# Patient Record
Sex: Female | Born: 1945 | Race: White | Hispanic: No | Smoking: Never smoker
Health system: Southern US, Community
[De-identification: ages and names within clinical notes are randomized; demographics above are authoritative.]

## PROBLEM LIST (undated history)

## (undated) DIAGNOSIS — K2270 Barrett's esophagus without dysplasia: Secondary | ICD-10-CM

## (undated) DIAGNOSIS — N189 Chronic kidney disease, unspecified: Secondary | ICD-10-CM

## (undated) DIAGNOSIS — T7840XA Allergy, unspecified, initial encounter: Secondary | ICD-10-CM

## (undated) DIAGNOSIS — K219 Gastro-esophageal reflux disease without esophagitis: Secondary | ICD-10-CM

## (undated) DIAGNOSIS — N289 Disorder of kidney and ureter, unspecified: Secondary | ICD-10-CM

## (undated) DIAGNOSIS — I10 Essential (primary) hypertension: Secondary | ICD-10-CM

## (undated) DIAGNOSIS — I4892 Unspecified atrial flutter: Secondary | ICD-10-CM

## (undated) DIAGNOSIS — C801 Malignant (primary) neoplasm, unspecified: Secondary | ICD-10-CM

## (undated) HISTORY — PX: BREAST LUMPECTOMY: SHX2

## (undated) HISTORY — PX: TONSILLECTOMY: SHX5217

## (undated) HISTORY — DX: Barrett's esophagus without dysplasia: K22.70

## (undated) HISTORY — DX: Unspecified atrial flutter: I48.92

---
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
## 1997-03-30 DIAGNOSIS — C801 Malignant (primary) neoplasm, unspecified: Secondary | ICD-10-CM

## 1997-03-30 HISTORY — DX: Malignant (primary) neoplasm, unspecified: C80.1

## 2018-08-09 ENCOUNTER — Other Ambulatory Visit (HOSPITAL_COMMUNITY): Payer: Self-pay | Admitting: Unknown Physician Specialty

## 2018-08-09 ENCOUNTER — Other Ambulatory Visit: Payer: Self-pay | Admitting: Unknown Physician Specialty

## 2018-08-09 DIAGNOSIS — K115 Sialolithiasis: Secondary | ICD-10-CM

## 2018-08-15 ENCOUNTER — Other Ambulatory Visit: Payer: Self-pay

## 2018-08-15 ENCOUNTER — Ambulatory Visit
Admission: RE | Admit: 2018-08-15 | Discharge: 2018-08-15 | Disposition: A | Discharge status: Home / Self Care | Payer: Medicare Other | Source: Ambulatory Visit | Attending: Unknown Physician Specialty | Admitting: Unknown Physician Specialty

## 2018-08-15 DIAGNOSIS — K115 Sialolithiasis: Secondary | ICD-10-CM | POA: Diagnosis not present

## 2018-08-15 HISTORY — DX: Disorder of kidney and ureter, unspecified: N28.9

## 2018-08-15 HISTORY — DX: Essential (primary) hypertension: I10

## 2018-08-15 LAB — POCT I-STAT CREATININE: Creatinine, Ser: 1.4 mg/dL — ABNORMAL HIGH (ref 0.44–1.00)

## 2018-08-15 MED ORDER — IOHEXOL 300 MG/ML  SOLN
75.0000 mL | Freq: Once | INTRAMUSCULAR | Status: AC | PRN
  Administered 2018-08-15: 14:00:00 75 mL via INTRAVENOUS

## 2018-08-16 ENCOUNTER — Other Ambulatory Visit: Payer: Self-pay | Admitting: Unknown Physician Specialty

## 2018-08-16 DIAGNOSIS — I6523 Occlusion and stenosis of bilateral carotid arteries: Secondary | ICD-10-CM

## 2018-08-19 ENCOUNTER — Emergency Department
Admission: EM | Admit: 2018-08-19 | Discharge: 2018-08-19 | Disposition: A | Discharge status: Home / Self Care | Payer: Medicare Other | Attending: Emergency Medicine | Admitting: Emergency Medicine

## 2018-08-19 ENCOUNTER — Encounter: Payer: Self-pay | Admitting: Emergency Medicine

## 2018-08-19 ENCOUNTER — Other Ambulatory Visit: Payer: Self-pay

## 2018-08-19 ENCOUNTER — Emergency Department: Payer: Medicare Other

## 2018-08-19 DIAGNOSIS — R079 Chest pain, unspecified: Secondary | ICD-10-CM | POA: Diagnosis not present

## 2018-08-19 DIAGNOSIS — I10 Essential (primary) hypertension: Secondary | ICD-10-CM | POA: Diagnosis not present

## 2018-08-19 LAB — CBC
HCT: 40.9 % (ref 36.0–46.0)
Hemoglobin: 13 g/dL (ref 12.0–15.0)
MCH: 30.4 pg (ref 26.0–34.0)
MCHC: 31.8 g/dL (ref 30.0–36.0)
MCV: 95.6 fL (ref 80.0–100.0)
Platelets: 221 10*3/uL (ref 150–400)
RBC: 4.28 MIL/uL (ref 3.87–5.11)
RDW: 13.4 % (ref 11.5–15.5)
WBC: 7.6 10*3/uL (ref 4.0–10.5)
nRBC: 0 % (ref 0.0–0.2)

## 2018-08-19 LAB — TROPONIN I
Troponin I: <0.03 ng/mL (ref <0.03)
Troponin I: <0.03 ng/mL (ref <0.03)

## 2018-08-19 LAB — BASIC METABOLIC PANEL
Anion gap: 7 (ref 5–15)
BUN: 19 mg/dL (ref 8–23)
CO2: 25 mmol/L (ref 22–32)
Calcium: 8.7 mg/dL — ABNORMAL LOW (ref 8.9–10.3)
Chloride: 106 mmol/L (ref 98–111)
Creatinine, Ser: 1.24 mg/dL — ABNORMAL HIGH (ref 0.44–1.00)
GFR calc Af Amer: 51 mL/min — ABNORMAL LOW (ref >60)
GFR calc non Af Amer: 44 mL/min — ABNORMAL LOW (ref >60)
Glucose, Bld: 104 mg/dL — ABNORMAL HIGH (ref 70–99)
Potassium: 4.2 mmol/L (ref 3.5–5.1)
Sodium: 138 mmol/L (ref 135–145)

## 2018-08-19 MED ORDER — SODIUM CHLORIDE 0.9% FLUSH: 3.0000 mL | Freq: Once | INTRAVENOUS | Status: DC

## 2018-08-19 MED ORDER — ASPIRIN 81 MG PO CHEW
324.0000 mg | CHEWABLE_TABLET | Freq: Once | ORAL | Status: AC
  Administered 2018-08-19: 324 mg via ORAL
  Filled 2018-08-19: qty 4

## 2018-08-19 NOTE — ED Notes (Signed)
Pt reports last night she was having palpitations. Pt states that she had some nausea and left arm discomfort with the palpitations. Pt went to bed, this morning palpitations were better but she was still having some left arm discomfort and nausea. Pt states that she has had 1 episode of A fib in past. Pt is from [place].

## 2018-08-19 NOTE — Discharge Instructions (Signed)

## 2018-08-19 NOTE — ED Triage Notes (Signed)
States last night while watching TV, felt left arm pain.  Then felt heart palpitations.  States symptoms improved and patient was able to sleep.  This am left arm a little 'uncomfortable' and small nausea.

## 2018-08-19 NOTE — ED Provider Notes (Signed)
[facility] Emergency Department Provider Note  ____________________________________________  Time seen: Approximately 1:55 PM  I have reviewed the triage vital signs and the nursing notes.   HISTORY  Chief Complaint Chest Pain   HPI Shirley Hardy is a 73 y.o. female with a history of hypertension and hyperlipidemia who presents for evaluation of chest pain.  Patient lives in [place] and is here visiting her mother.  Over the last several days her allergies have been flaring up due to pollen.  She has had a dry cough and some congestion/postnasal drip.  Last night, she was watching TV when she started having soreness in her left arm.  She then started feeling her heart flopping in her chest, she denies pain in her chest.  She checked her pulse which was elevated and irregular.  She felt a little bit of nausea.  She went to bed and was able to fall asleep.  This morning when she woke up she was no longer feeling palpitations but had mild discomfort in the left side of her neck.  She then decided to come to the emergency room to get evaluated.  Patient has a history of one episode of A. fib in the past.  She never had A. fib again.  She is on metoprolol.  She is not on any blood thinners.  She flew from [place] several weeks ago to be with her mother.  She has no personal or family history of blood clots, recent long travel or immobilization, leg pain or swelling, hemoptysis, or exogenous hormones.  No exposures to COVID.  No fever or chills.  No history of smoking.  No personal or family history of heart attacks  Past Medical History:  Diagnosis Date  . Hypertension   . Renal insufficiency    Allergies Patient has no known allergies.  FH No history of CAD, MI, PE, DVT  Social History Social History   Tobacco Use  . Smoking status: Never Smoker  . Smokeless tobacco: Never Used  Substance Use Topics  . Alcohol use: Not on file  . Drug use: Not on file     Review of Systems  Constitutional: Negative for fever. Eyes: Negative for visual changes. ENT: Negative for sore throat. Neck: No neck pain  Cardiovascular: Negative for chest pain. + palpitations Respiratory: Negative for shortness of breath. Gastrointestinal: Negative for abdominal pain, vomiting or diarrhea. + nausea Genitourinary: Negative for dysuria. Musculoskeletal: Negative for back pain. + L arm and L neck pain Skin: Negative for rash. Neurological: Negative for headaches, weakness or numbness. Psych: No SI or HI  ____________________________________________   PHYSICAL EXAM:  VITAL SIGNS: ED Triage Vitals  Enc Vitals Group     BP 08/19/18 1223 (!) 161/79     Pulse Rate 08/19/18 1223 68     Resp 08/19/18 1223 18     Temp 08/19/18 1223 98.2 F (36.8 C)     Temp Source 08/19/18 1223 Oral     SpO2 08/19/18 1223 99 %     Weight 08/19/18 1223 185 lb (83.9 kg)     Height 08/19/18 1223 5' 4.5" (1.638 m)     Head Circumference --      Peak Flow --      Pain Score 08/19/18 1227 1     Pain Loc --      Pain Edu? --      Excl. in [place]? --     Constitutional: Alert and oriented. Well appearing and  in no apparent distress. HEENT:      Head: Normocephalic and atraumatic.         Eyes: Conjunctivae are normal. Sclera is non-icteric.       Mouth/Throat: Mucous membranes are moist.       Neck: Supple with no signs of meningismus. Cardiovascular: Regular rate and rhythm. No murmurs, gallops, or rubs. 2+ symmetrical distal pulses are present in all extremities. No JVD. Respiratory: Normal respiratory effort. Lungs are clear to auscultation bilaterally. No wheezes, crackles, or rhonchi.  Gastrointestinal: Soft, non tender, and non distended with positive bowel sounds. No rebound or guarding. Musculoskeletal: Nontender with normal range of motion in all extremities. No edema, cyanosis, or erythema of extremities. Neurologic: Normal speech and language. Face is symmetric.  Moving all extremities. No gross focal neurologic deficits are appreciated. Skin: Skin is warm, dry and intact. No rash noted. Psychiatric: Mood and affect are normal. Speech and behavior are normal.  ____________________________________________   LABS (all labs ordered are listed, but only abnormal results are displayed)  Labs Reviewed  BASIC METABOLIC PANEL - Abnormal; Notable for the following components:      Result Value   Glucose, Bld 104 (*)    Creatinine, Ser 1.24 (*)    Calcium 8.7 (*)    GFR calc non Af Amer 44 (*)    GFR calc Af Amer 51 (*)    All other components within normal limits  CBC  TROPONIN I  TROPONIN I   ____________________________________________  EKG  ED ECG REPORT I, Rudene Re, the attending physician, personally viewed and interpreted this ECG.  Normal sinus rhythm, rate of 62, first-degree AV block, normal QTC, normal axis, no ST elevations or depressions.  No prior for comparison. ____________________________________________  RADIOLOGY  I have personally reviewed the images performed during this visit and I agree with the Radiologist's read.   Interpretation by Radiologist:  Dg Chest 2 View  Result Date: 08/19/2018 CLINICAL DATA:  Chest pain, shortness of breath. EXAM: CHEST - 2 VIEW COMPARISON:  None. FINDINGS: The heart size and mediastinal contours are within normal limits. Both lungs are clear. No pneumothorax or pleural effusion is noted. The visualized skeletal structures are unremarkable. IMPRESSION: No active cardiopulmonary disease. Electronically Signed   By: Marijo Conception M.D.   On: 08/19/2018 13:22     ____________________________________________   PROCEDURES  Procedure(s) performed: None Procedures Critical Care performed:  None ____________________________________________   INITIAL IMPRESSION / ASSESSMENT AND PLAN / ED COURSE  73 y.o. female with a history of hypertension and hyperlipidemia who presents for  evaluation of left arm pain, irregular heartbeat.  Patient is extremely well-appearing and in no distress, she has normal vital signs, normal work of breathing, normal sats, lungs are clear to auscultation.  Heart regular rate and rhythm with no murmurs noticed.  EKG with no evidence of dysrhythmias or ischemia.  Differential diagnoses including ACS versus dysrhythmias versus viral URI versus pneumonia.  Low suspicion for PE with intermittent symptoms, no risk factors, no tachypnea, no tachycardia, no hypoxia.  Low suspicion for COVID with intermittent symptoms, no fever, or shortness of breath.  Will monitor patient on telemetry for any signs of dysrhythmias.  Heart score of 3, low risk but will further stratify with troponin x2. Will get labs.   _________________________ 2:27 PM on 08/19/2018 -----------------------------------------  Labs showing no leukocytosis, first troponin is negative.  Stable creatinine, normal electrolytes.  Chest x-ray negative for pneumonia, pulmonary edema, pneumothorax.  Second troponin is  pending  Clinical Course as of Aug 19 1506  Fri Aug 19, 2018  1505 Patient remains with no further episodes of chest pain.  Second troponin is pending.  Care transferred to Dr. Cinda Quest.   [CV]    Clinical Course User Index [CV] Alfred Levins [place], MD     As part of my medical decision making, I reviewed the following data within the [facility] notes reviewed and incorporated, Labs reviewed , EKG interpreted , Radiograph reviewed , Notes from prior ED visits and [place] Controlled Substance Database    Pertinent labs & imaging results that were available during my care of the patient were reviewed by me and considered in my medical decision making (see chart for details).    ____________________________________________   FINAL CLINICAL IMPRESSION(S) / ED DIAGNOSES  Final diagnoses:  Chest pain, unspecified type      NEW MEDICATIONS STARTED  DURING THIS VISIT:  ED Discharge Orders    None       Note:  This document was prepared using Dragon voice recognition software and may include unintentional dictation errors.    Alfred Levins, [place], MD 08/19/18 [id number]

## 2018-08-25 ENCOUNTER — Other Ambulatory Visit: Payer: Self-pay

## 2018-08-25 ENCOUNTER — Ambulatory Visit
Admission: RE | Admit: 2018-08-25 | Discharge: 2018-08-25 | Disposition: A | Discharge status: Home / Self Care | Payer: Medicare Other | Source: Ambulatory Visit | Attending: Unknown Physician Specialty | Admitting: Unknown Physician Specialty

## 2018-08-25 DIAGNOSIS — I6523 Occlusion and stenosis of bilateral carotid arteries: Secondary | ICD-10-CM | POA: Insufficient documentation

## 2018-09-12 ENCOUNTER — Other Ambulatory Visit
Admission: RE | Admit: 2018-09-12 | Discharge: 2018-09-12 | Disposition: A | Discharge status: Home / Self Care | Payer: Medicare Other | Source: Ambulatory Visit | Attending: Unknown Physician Specialty | Admitting: Unknown Physician Specialty

## 2018-09-12 ENCOUNTER — Other Ambulatory Visit: Payer: Self-pay

## 2018-09-12 DIAGNOSIS — Z1159 Encounter for screening for other viral diseases: Secondary | ICD-10-CM | POA: Diagnosis present

## 2018-09-13 LAB — NOVEL CORONAVIRUS, NAA (HOSP ORDER, SEND-OUT TO REF LAB; TAT 18-24 HRS): SARS-CoV-2, NAA: NOT DETECTED

## 2018-09-14 NOTE — Discharge Instructions (Signed)
General Anesthesia, Adult, Care After  This sheet gives you information about how to care for yourself after your procedure. Your health care provider may also give you more specific instructions. If you have problems or questions, contact your health care provider.  What can I expect after the procedure?  After the procedure, the following side effects are common:  Pain or discomfort at the IV site.  Nausea.  Vomiting.  Sore throat.  Trouble concentrating.  Feeling cold or chills.  Weak or tired.  Sleepiness and fatigue.  Soreness and body aches. These side effects can affect parts of the body that were not involved in surgery.  Follow these instructions at home:    For at least 24 hours after the procedure:  Have a responsible adult stay with you. It is important to have someone help care for you until you are awake and alert.  Rest as needed.  Do not:  Participate in activities in which you could fall or become injured.  Drive.  Use heavy machinery.  Drink alcohol.  Take sleeping pills or medicines that cause drowsiness.  Make important decisions or sign legal documents.  Take care of children on your own.  Eating and drinking  Follow any instructions from your health care provider about eating or drinking restrictions.  When you feel hungry, start by eating small amounts of foods that are soft and easy to digest (bland), such as toast. Gradually return to your regular diet.  Drink enough fluid to keep your urine pale yellow.  If you vomit, rehydrate by drinking water, juice, or clear broth.  General instructions  If you have sleep apnea, surgery and certain medicines can increase your risk for breathing problems. Follow instructions from your health care provider about wearing your sleep device:  Anytime you are sleeping, including during daytime naps.  While taking prescription pain medicines, sleeping medicines, or medicines that make you drowsy.  Return to your normal activities as told by your health care  provider. Ask your health care provider what activities are safe for you.  Take over-the-counter and prescription medicines only as told by your health care provider.  If you smoke, do not smoke without supervision.  Keep all follow-up visits as told by your health care provider. This is important.  Contact a health care provider if:  You have nausea or vomiting that does not get better with medicine.  You cannot eat or drink without vomiting.  You have pain that does not get better with medicine.  You are unable to pass urine.  You develop a skin rash.  You have a fever.  You have redness around your IV site that gets worse.  Get help right away if:  You have difficulty breathing.  You have chest pain.  You have blood in your urine or stool, or you vomit blood.  Summary  After the procedure, it is common to have a sore throat or nausea. It is also common to feel tired.  Have a responsible adult stay with you for the first 24 hours after general anesthesia. It is important to have someone help care for you until you are awake and alert.  When you feel hungry, start by eating small amounts of foods that are soft and easy to digest (bland), such as toast. Gradually return to your regular diet.  Drink enough fluid to keep your urine pale yellow.  Return to your normal activities as told by your health care provider. Ask your health care   provider what activities are safe for you.  This information is not intended to replace advice given to you by your health care provider. Make sure you discuss any questions you have with your health care provider.  Document Released: 06/22/2000 Document Revised: 10/30/2016 Document Reviewed: 10/30/2016  Elsevier Interactive Patient Education  2019 [employer]

## 2018-09-15 ENCOUNTER — Other Ambulatory Visit: Payer: Self-pay

## 2018-09-15 ENCOUNTER — Encounter
Admission: RE | Disposition: A | Discharge status: Home / Self Care | Payer: Self-pay | Source: Home / Self Care | Attending: Unknown Physician Specialty

## 2018-09-15 ENCOUNTER — Ambulatory Visit
Admission: RE | Admit: 2018-09-15 | Discharge: 2018-09-15 | Disposition: A | Discharge status: Home / Self Care | Payer: Medicare Other | Attending: Unknown Physician Specialty | Admitting: Unknown Physician Specialty

## 2018-09-15 ENCOUNTER — Ambulatory Visit: Payer: Medicare Other | Admitting: Anesthesiology

## 2018-09-15 DIAGNOSIS — K115 Sialolithiasis: Secondary | ICD-10-CM | POA: Insufficient documentation

## 2018-09-15 DIAGNOSIS — I10 Essential (primary) hypertension: Secondary | ICD-10-CM | POA: Diagnosis not present

## 2018-09-15 DIAGNOSIS — K219 Gastro-esophageal reflux disease without esophagitis: Secondary | ICD-10-CM | POA: Insufficient documentation

## 2018-09-15 HISTORY — DX: Chronic kidney disease, unspecified: N18.9

## 2018-09-15 HISTORY — DX: Gastro-esophageal reflux disease without esophagitis: K21.9

## 2018-09-15 HISTORY — PX: SALIVARY STONE REMOVAL: SHX5213

## 2018-09-15 HISTORY — DX: Allergy, unspecified, initial encounter: T78.40XA

## 2018-09-15 SURGERY — REMOVAL, CALCULUS, SALIVARY DUCT
Anesthesia: General | Site: Mouth | Laterality: Right

## 2018-09-15 MED ORDER — LIDOCAINE-EPINEPHRINE 1 %-1:100000 IJ SOLN
INTRAMUSCULAR | Status: DC | PRN
  Administered 2018-09-15: .5 mL

## 2018-09-15 MED ORDER — LIDOCAINE HCL (CARDIAC) PF 100 MG/5ML IV SOSY
PREFILLED_SYRINGE | INTRAVENOUS | Status: DC | PRN
  Administered 2018-09-15: 20 mg via INTRAVENOUS

## 2018-09-15 MED ORDER — PROPOFOL 500 MG/50ML IV EMUL
INTRAVENOUS | Status: DC | PRN
  Administered 2018-09-15: 100 ug/kg/min via INTRAVENOUS

## 2018-09-15 MED ORDER — FENTANYL CITRATE (PF) 100 MCG/2ML IJ SOLN
25.0000 ug | INTRAMUSCULAR | Status: DC | PRN
  Administered 2018-09-15: 09:00:00 50 ug via INTRAVENOUS

## 2018-09-15 MED ORDER — AMOXICILLIN-POT CLAVULANATE 875-125 MG PO TABS: 1.0000 | ORAL_TABLET | Freq: Two times a day (BID) | ORAL | 0 refills | Status: AC

## 2018-09-15 MED ORDER — OXYCODONE HCL 5 MG/5ML PO SOLN: 5.0000 mg | Freq: Once | ORAL | Status: DC | PRN

## 2018-09-15 MED ORDER — MIDAZOLAM HCL 2 MG/2ML IJ SOLN
INTRAMUSCULAR | Status: DC | PRN
  Administered 2018-09-15: 2 mg via INTRAVENOUS

## 2018-09-15 MED ORDER — ONDANSETRON HCL 4 MG/2ML IJ SOLN
INTRAMUSCULAR | Status: DC | PRN
  Administered 2018-09-15: 4 mg via INTRAVENOUS

## 2018-09-15 MED ORDER — ACETAMINOPHEN 10 MG/ML IV SOLN: 1000.0000 mg | Freq: Once | INTRAVENOUS | Status: DC

## 2018-09-15 MED ORDER — OXYCODONE HCL 5 MG PO TABS: 5.0000 mg | ORAL_TABLET | Freq: Once | ORAL | Status: DC | PRN

## 2018-09-15 MED ORDER — LACTATED RINGERS IV SOLN
INTRAVENOUS | Status: DC
  Administered 2018-09-15: 08:00:00 via INTRAVENOUS

## 2018-09-15 SURGICAL SUPPLY — 14 items
CORD BIP STRL DISP 12FT (MISCELLANEOUS) ×2 IMPLANT
ELECT REM PT RETURN 9FT ADLT (ELECTROSURGICAL) ×3
ELECTRODE REM PT RTRN 9FT ADLT (ELECTROSURGICAL) ×1 IMPLANT
GLOVE BIO SURGEON STRL SZ7.5 (GLOVE) ×4 IMPLANT
KIT TURNOVER KIT A (KITS) ×3 IMPLANT
NDL HYPO 25GX1X1/2 BEV (NEEDLE) ×1 IMPLANT
NEEDLE HYPO 25GX1X1/2 BEV (NEEDLE) ×3 IMPLANT
NS IRRIG 500ML POUR BTL (IV SOLUTION) ×3 IMPLANT
PACK ENT CUSTOM (PACKS) ×3 IMPLANT
STRAP BODY AND KNEE 60X3 (MISCELLANEOUS) ×3 IMPLANT
SUCTION FRAZIER TIP 10 FR DISP (SUCTIONS) ×2 IMPLANT
SUT VICRYL 6-0  S14 CTD (SUTURE) ×2
SUT VICRYL 6-0 S14 CTD (SUTURE) IMPLANT
SYR 10ML LL (SYRINGE) ×3 IMPLANT

## 2018-09-15 NOTE — Anesthesia Procedure Notes (Signed)
Date/Time: 09/15/2018 8:52 AM Performed by: Cameron Ali, CRNA Pre-anesthesia Checklist: Patient identified, Emergency Drugs available, Suction available, Timeout performed and Patient being monitored Patient Re-evaluated:Patient Re-evaluated prior to induction Oxygen Delivery Method: Nasal cannula Placement Confirmation: positive ETCO2

## 2018-09-15 NOTE — Transfer of Care (Signed)
Immediate Anesthesia Transfer of Care Note  Patient: Shirley Hardy  Procedure(s) Performed: SALIVARY STONE REMOVAL/SIALODUCOPLASTY (Right Mouth)  Patient Location: PACU  Anesthesia Type: General  Level of Consciousness: awake, alert  and patient cooperative  Airway and Oxygen Therapy: Patient Spontanous Breathing and Patient connected to supplemental oxygen  Post-op Assessment: Post-op Vital signs reviewed, Patient's Cardiovascular Status Stable, Respiratory Function Stable, Patent Airway and No signs of Nausea or vomiting  Post-op Vital Signs: Reviewed and stable  Complications: No apparent anesthesia complications

## 2018-09-15 NOTE — H&P (Signed)
The patient's history has been reviewed, patient examined, no change in status, stable for surgery.  Questions were answered to the patients satisfaction.  

## 2018-09-15 NOTE — Op Note (Signed)
09/15/2018  9:25 AM    [name]  [id number]   Pre-Op Dx: Sialothiasis  Post-op Dx: SAME  Proc: Excision right submandibular duct stone, sialodochoplasty  Surg:  Roena Malady  Anes:  GOT  EBL: Less than 10 cc  Comp: None  Findings: Large stone right submandibular gland duct  Procedure: Shirley Hardy was identified in the holding area take the operating room placed in supine position.  After IV sedation she was draped in usual fashion for oral surgery.  Palpation of the anterior floor mouth there was an obvious stone in the distal portion of the submandibular duct.  A local anesthetic of 1% lidocaine with 100,000 units epinephrine is used to inject the right anterior floor mouth.  A total of half a cc was used.  An 11 blade was used to incise the area of [place] duct on the right.  The duct was traced proximally and the stone was encountered.  Short sharp scissors were used to open the duct the stone was extracted.  Immediately there was flow of saliva with muco- purulent discharge and another small fragment of stone to come out as well.  The stones were photo documented and sent for specimen.  The area was then repalpated no evidence of residual stone was identified.  Massage of the right submandibular gland showed excellent flow of saliva.  A 6-0 Vicryl was then used to form a sialodochoplasty by sewing the open ends of the submandibular duct to the mucosa to prevent scarring.   Re-massage of the gland showed excellent flow of saliva.  With no active bleeding the patient was turned to anesthesia where she was taken to recovery room in stable condition.  Dispo:   Good  Plan: Discharged home I will have her plenty of fluids clear liquid diet for the next 24 hours use sialagogues to continue salivary flow to prevent during of the duct.  Follow-up 1 week  Roena Malady  09/15/2018 9:25 AM

## 2018-09-15 NOTE — Anesthesia Postprocedure Evaluation (Signed)
Anesthesia Post Note  Patient: Shirley Hardy  Procedure(s) Performed: SALIVARY STONE REMOVAL/SIALODUCOPLASTY (Right Mouth)  Patient location during evaluation: PACU Anesthesia Type: General Level of consciousness: awake Pain management: pain level controlled Vital Signs Assessment: post-procedure vital signs reviewed and stable Respiratory status: respiratory function stable Cardiovascular status: stable Postop Assessment: no signs of nausea or vomiting Anesthetic complications: no    Veda Canning

## 2018-09-15 NOTE — Anesthesia Preprocedure Evaluation (Addendum)
Anesthesia Evaluation  Patient identified by MRN, date of birth, ID band Patient awake    Reviewed: Allergy & Precautions, NPO status , Patient's Chart, lab work & pertinent test results  Airway Mallampati: II  TM Distance: >3 FB     Dental   Pulmonary neg pulmonary ROS,    breath sounds clear to auscultation       Cardiovascular hypertension,  Rhythm:Regular Rate:Normal     Neuro/Psych    GI/Hepatic GERD  ,  Endo/Other  BMI 31  Renal/GU Renal InsufficiencyRenal disease     Musculoskeletal   Abdominal   Peds  Hematology   Anesthesia Other Findings   Reproductive/Obstetrics                             Anesthesia Physical Anesthesia Plan  ASA: II  Anesthesia Plan: General   Post-op Pain Management:    Induction:   PONV Risk Score and Plan:   Airway Management Planned: Natural Airway and Nasal Cannula  Additional Equipment:   Intra-op Plan:   Post-operative Plan:   Informed Consent: I have reviewed the patients History and Physical, chart, labs and discussed the procedure including the risks, benefits and alternatives for the proposed anesthesia with the patient or authorized representative who has indicated his/her understanding and acceptance.       Plan Discussed with: CRNA  Anesthesia Plan Comments:        Anesthesia Quick Evaluation

## 2018-09-16 ENCOUNTER — Encounter: Payer: Self-pay | Admitting: Unknown Physician Specialty

## 2018-09-19 LAB — SURGICAL PATHOLOGY

## 2018-09-20 ENCOUNTER — Encounter (INDEPENDENT_AMBULATORY_CARE_PROVIDER_SITE_OTHER): Payer: Medicare Other | Admitting: Vascular Surgery

## 2018-09-27 ENCOUNTER — Encounter (INDEPENDENT_AMBULATORY_CARE_PROVIDER_SITE_OTHER): Payer: Self-pay | Admitting: Vascular Surgery

## 2018-09-27 ENCOUNTER — Other Ambulatory Visit: Payer: Self-pay

## 2018-09-27 ENCOUNTER — Ambulatory Visit (INDEPENDENT_AMBULATORY_CARE_PROVIDER_SITE_OTHER): Payer: Medicare Other | Admitting: Vascular Surgery

## 2018-09-27 DIAGNOSIS — I129 Hypertensive chronic kidney disease with stage 1 through stage 4 chronic kidney disease, or unspecified chronic kidney disease: Secondary | ICD-10-CM | POA: Diagnosis not present

## 2018-09-27 DIAGNOSIS — N189 Chronic kidney disease, unspecified: Secondary | ICD-10-CM

## 2018-09-27 DIAGNOSIS — I6529 Occlusion and stenosis of unspecified carotid artery: Secondary | ICD-10-CM | POA: Insufficient documentation

## 2018-09-27 DIAGNOSIS — I6523 Occlusion and stenosis of bilateral carotid arteries: Secondary | ICD-10-CM

## 2018-09-27 DIAGNOSIS — I10 Essential (primary) hypertension: Secondary | ICD-10-CM

## 2018-09-27 DIAGNOSIS — Z79899 Other long term (current) drug therapy: Secondary | ICD-10-CM | POA: Diagnosis not present

## 2018-09-27 NOTE — Progress Notes (Signed)
Patient ID: Shirley Hardy, female   DOB: 06/05/1945, 73 y.o.   MRN: [id number]  Chief Complaint  Patient presents with  . New Patient (Initial Visit)    carotid stenosis    HPI Shirley Hardy is a 73 y.o. female.  I am asked to see the patient by Dr. Tami Ribas for evaluation of carotid stenosis.  The patient reports no focal symptoms of cerebrovascular ischemia. Specifically, the patient denies amaurosis fugax, speech or swallowing difficulties, or arm or leg weakness or numbness.  She recently had an ENT procedure and a carotid duplex was performed as part of her work-up.  I have independently reviewed this carotid duplex.  This demonstrates velocities interpreted by radiologist as less than 50% carotid stenosis on the right and 50 to 69% carotid artery stenosis on the left with velocities on the lower end of the range.   Past Medical History:  Diagnosis Date  . Allergies   . Chronic kidney disease   . GERD (gastroesophageal reflux disease)   . Hypertension   . Renal insufficiency     Past Surgical History:  Procedure Laterality Date  . BREAST LUMPECTOMY Left   . SALIVARY STONE REMOVAL Right 09/15/2018   Procedure: SALIVARY STONE REMOVAL/SIALODUCOPLASTY;  Surgeon: Beverly Gust, MD;  Location: [facility];  Service: ENT;  Laterality: Right;  . TONSILLECTOMY      Family History  Problem Relation Age of Onset  . Cancer Mother   . Breast cancer Mother   . Atrial fibrillation Mother   . Parkinson's disease Brother   No bleeding or clotting disorders  Social History Social History   Tobacco Use  . Smoking status: Never Smoker  . Smokeless tobacco: Never Used  Substance Use Topics  . Alcohol use: Not Currently  . Drug use: Not on file  No IVDU  Allergies  Allergen Reactions  . Contrast Media [Iodinated Diagnostic Agents]     Current Outpatient Medications  Medication Sig Dispense Refill  . acetaminophen (TYLENOL) 500 MG tablet Take 500 mg by mouth 2  (two) times a day.    Marland Kitchen acyclovir (ZOVIRAX) 400 MG tablet Take 400 mg by mouth daily.    Marland Kitchen aspirin EC 81 MG tablet Take 81 mg by mouth daily.    . cetirizine (ZYRTEC) 10 MG tablet Take 10 mg by mouth daily as needed for allergies.    Marland Kitchen losartan (COZAAR) 50 MG tablet Take 50 mg by mouth daily.    Marland Kitchen omeprazole (PRILOSEC) 20 MG capsule Take 20 mg by mouth daily.    . pravastatin (PRAVACHOL) 40 MG tablet Take 40 mg by mouth daily.    . TOPROL XL 25 MG 24 hr tablet Take 25 mg by mouth daily.    . TOPROL XL 50 MG 24 hr tablet Take 50 mg by mouth daily.     No current facility-administered medications for this visit.       REVIEW OF SYSTEMS (Negative unless checked)  Constitutional: [] Weight loss  [] Fever  [] Chills Cardiac: [] Chest pain   [] Chest pressure   [] Palpitations   [] Shortness of breath when laying flat   [] Shortness of breath at rest   [] Shortness of breath with exertion. Vascular:  [] Pain in legs with walking   [] Pain in legs at rest   [] Pain in legs when laying flat   [] Claudication   [] Pain in feet when walking  [] Pain in feet at rest  [] Pain in feet when laying flat   [] History of DVT   []   Phlebitis   [] Swelling in legs   [] Varicose veins   [] Non-healing ulcers Pulmonary:   [] Uses home oxygen   [] Productive cough   [] Hemoptysis   [] Wheeze  [] COPD   [] Asthma Neurologic:  [] Dizziness  [] Blackouts   [] Seizures   [] History of stroke   [] History of TIA  [] Aphasia   [] Temporary blindness   [] Dysphagia   [] Weakness or numbness in arms   [] Weakness or numbness in legs Musculoskeletal:  [x] Arthritis   [] Joint swelling   [x] Joint pain   [] Low back pain Hematologic:  [] Easy bruising  [] Easy bleeding   [] Hypercoagulable state   [] Anemic  [] Hepatitis Gastrointestinal:  [] Blood in stool   [] Vomiting blood  [x] Gastroesophageal reflux/heartburn   [] Abdominal pain Genitourinary:  [x] Chronic kidney disease   [] Difficult urination  [] Frequent urination  [] Burning with urination   [] Hematuria Skin:   [] Rashes   [] Ulcers   [] Wounds Psychological:  [] History of anxiety   []  History of major depression.    Physical Exam BP 128/85   Pulse 60   Resp 16   Ht 5\' 5"  (1.651 m)   Wt 193 lb (87.5 kg)   BMI 32.12 kg/m  Gen:  WD/WN, NAD Head: [place]/AT, No temporalis wasting. Prominent temp pulse not noted. Ear/Nose/Throat: Hearing grossly intact, nares w/o erythema or drainage, oropharynx w/o Erythema/Exudate Eyes: Conjunctiva clear, sclera non-icteric  Neck: trachea midline.  No bruits Pulmonary:  Good air movement, clear to auscultation bilaterally.  Cardiac: RRR, no JVD Vascular:  Vessel Right Left  Radial Palpable Palpable                          PT Palpable Palpable  DP Palpable Palpable   Gastrointestinal: soft, non-tender/non-distended.  Musculoskeletal: M/S 5/5 throughout.  Extremities without ischemic changes.  No deformity or atrophy. No significant LE edema. Neurologic: Sensation grossly intact in extremities.  Symmetrical.  Speech is fluent. Motor exam as listed above. Psychiatric: Judgment intact, Mood & affect appropriate for pt's clinical situation. Dermatologic: No rashes or ulcers noted.  No cellulitis or open wounds.   Radiology No results found.  Labs Recent Results (from the past 2160 hour(s))  I-STAT creatinine     Status: Abnormal   Collection Time: 08/15/18  2:22 PM  Result Value Ref Range   Creatinine, Ser 1.40 (H) 0.44 - 1.00 mg/dL  Basic metabolic panel     Status: Abnormal   Collection Time: 08/19/18 12:22 PM  Result Value Ref Range   Sodium 138 135 - 145 mmol/L   Potassium 4.2 3.5 - 5.1 mmol/L   Chloride 106 98 - 111 mmol/L   CO2 25 22 - 32 mmol/L   Glucose, Bld 104 (H) 70 - 99 mg/dL   BUN 19 8 - 23 mg/dL   Creatinine, Ser 1.24 (H) 0.44 - 1.00 mg/dL   Calcium 8.7 (L) 8.9 - 10.3 mg/dL   GFR calc non Af Amer 44 (L) >60 mL/min   GFR calc Af Amer 51 (L) >60 mL/min   Anion gap 7 5 - 15    Comment: Performed at [facility], [address] [place], [place] [address]  CBC     Status: None   Collection Time: 08/19/18 12:22 PM  Result Value Ref Range   WBC 7.6 4.0 - 10.5 K/uL   RBC 4.28 3.87 - 5.11 MIL/uL   Hemoglobin 13.0 12.0 - 15.0 g/dL   HCT 40.9 36.0 - 46.0 %   MCV 95.6 80.0 - 100.0  fL   MCH 30.4 26.0 - 34.0 pg   MCHC 31.8 30.0 - 36.0 g/dL   RDW 13.4 11.5 - 15.5 %   Platelets 221 150 - 400 K/uL   nRBC 0.0 0.0 - 0.2 %    Comment: Performed at [facility], [address] [place], [place] [address]  Troponin I - ONCE - STAT     Status: None   Collection Time: 08/19/18 12:22 PM  Result Value Ref Range   Troponin I <0.03 <0.03 ng/mL    Comment: Performed at [facility], [address] [place], [place] [address]  Troponin I - Once-Timed     Status: None   Collection Time: 08/19/18  3:31 PM  Result Value Ref Range   Troponin I <0.03 <0.03 ng/mL    Comment: Performed at [facility], [address] [place], [place] [address]  Novel Coronavirus, NAA (hospital order; send-out to ref lab)     Status: None   Collection Time: 09/12/18  2:13 PM   Specimen: Nasopharyngeal Swab; Respiratory  Result Value Ref Range   SARS-CoV-2, NAA NOT DETECTED NOT DETECTED    Comment: (NOTE) This test was developed and its performance characteristics determined by [employer]. This test has not been FDA cleared or approved. This test has been authorized by FDA under an Emergency Use Authorization (EUA). This test is only authorized for the duration of time the declaration that circumstances exist justifying the authorization of the emergency use of in vitro diagnostic tests for detection of SARS-CoV-2 virus and/or diagnosis of COVID-19 infection under section 564(b)(1) of the Act, 21 U.S.C. 026VZC-5(Y)(8), unless the authorization is terminated or revoked sooner. When diagnostic testing is negative, the possibility of a false negative result should be considered in the context of a  patient's recent exposures and the presence of clinical signs and symptoms consistent with COVID-19. An individual without symptoms of COVID-19 and who is not shedding SARS-CoV-2 virus would expect to have a negative (not detected) result in this assay. Performed  At: [facility] [address] [place], [place] [id number] Rush Farmer MD NO:[id number]    Coronavirus Source NASOPHARYNGEAL     Comment: Performed at [facility], [address] [place], [place] [address]  Surgical pathology     Status: None   Collection Time: 09/15/18  9:09 AM  Result Value Ref Range   SURGICAL PATHOLOGY      Surgical Pathology CASE: [id number] PATIENT: [name] Surgical Pathology Report     SPECIMEN SUBMITTED: A. Duct stone, submandibular  CLINICAL HISTORY: None provided  PRE-OPERATIVE DIAGNOSIS: Sialothiasis  POST-OPERATIVE DIAGNOSIS: Same as pre-op     DIAGNOSIS: A. SUBMANDIBULAR GLAND DUCT, CALCULUS; REMOVAL: - FRAGMENTS OF CALCULUS (2). - GROSS EXAMINATION ONLY.   GROSS DESCRIPTION: A. Labeled: Submandibular duct stone Received: In formalin Tissue fragment(s): 2 Size: Aggregate 0.6 x 0.4 x 0.2 cm Description: 2 pale yellow firm tissues possibly calculi. For gross examination only.   Final Diagnosis performed by Raynelle Bring, MD.   Electronically signed 09/19/2018 10:11:14AM The electronic signature indicates that the named Attending Pathologist has evaluated the specimen  Technical component performed at [facility], [address] [place], [place] [address] Lab: [phone] Dir: Rush Farmer, MD, MMM  Professional component performed at  [facility], [facility], [address], [place], [place] [address] Lab: [phone] Dir: Dellia Nims. Rubinas, MD     Assessment/Plan:  Essential hypertension blood pressure control important in reducing the progression of atherosclerotic disease. On  appropriate oral medications.   CKD  (chronic kidney disease) Try to limit contrast with any evaluation.  Follow-up will be with duplex when feasible.  Carotid stenosis I have independently reviewed this carotid duplex.  This demonstrates velocities interpreted by radiologist as less than 50% carotid stenosis on the right and 50 to 69% carotid artery stenosis on the left with velocities on the lower end of the range. At this point, the disease is below the threshold for consideration for prophylactic repair.  Continued follow-up with duplex would be recommended and I would recommend checking that in 5 to 6 months.  She is on a baby aspirin daily and should continue this.  She takes a statin on a less than daily basis and we discussed that taking this daily may slow the progression of the disease.  She does not live locally, but is displaced from [place] caring for her mother.  I discussed with her if she remains here in 6 months I will be happy to perform her follow-up duplex but if she is not, she should have this done when she returns home.  We discussed the pathophysiology and natural history of carotid disease and she voices her understanding at this time.      Leotis Pain 09/27/2018, 11:07 AM   This note was created with Dragon medical transcription system.  Any errors from dictation are unintentional.

## 2018-09-27 NOTE — Assessment & Plan Note (Signed)
blood pressure control important in reducing the progression of atherosclerotic disease. On appropriate oral medications.  

## 2018-09-27 NOTE — Assessment & Plan Note (Signed)
I have independently reviewed this carotid duplex.  This demonstrates velocities interpreted by radiologist as less than 50% carotid stenosis on the right and 50 to 69% carotid artery stenosis on the left with velocities on the lower end of the range. At this point, the disease is below the threshold for consideration for prophylactic repair.  Continued follow-up with duplex would be recommended and I would recommend checking that in 5 to 6 months.  She is on a baby aspirin daily and should continue this.  She takes a statin on a less than daily basis and we discussed that taking this daily may slow the progression of the disease.  She does not live locally, but is displaced from [place] caring for her mother.  I discussed with her if she remains here in 6 months I will be happy to perform her follow-up duplex but if she is not, she should have this done when she returns home.  We discussed the pathophysiology and natural history of carotid disease and she voices her understanding at this time.

## 2018-09-27 NOTE — Assessment & Plan Note (Signed)
Try to limit contrast with any evaluation.  Follow-up will be with duplex when feasible.

## 2018-09-27 NOTE — Patient Instructions (Signed)
Carotid Artery Disease  The carotid arteries are arteries on both sides of the neck. They carry blood to the brain, face, and neck. Carotid artery disease happens when these arteries become smaller (narrow) or get blocked. If these arteries become smaller or get blocked, you are more likely to have a stroke or a warning stroke (transient ischemic attack). Follow these instructions at home:  Take over-the-counter and prescription medicines only as told by your doctor.  Make sure you understand all instructions about your medicines. Do not stop taking your medicines without talking to your doctor first.  Follow your doctor's diet instructions. It is important to follow a healthy diet. ? Eat foods that include plenty of: ? Fresh fruits. ? Vegetables. ? Lean meats. ? Avoid these foods: ? Foods that are high in fat. ? Foods that are high in salt (sodium). ? Foods that are fried. ? Foods that are processed. ? Foods that have few good nutrients (poor nutritional value).  Keep a healthy weight.  Stay active. Get at least 30 minutes of activity every day.  Do not smoke.  Limit alcohol use to: ? No more than 2 drinks a day for men. ? No more than 1 drink a day for women who are not pregnant.  Do not use illegal drugs.  Keep all follow-up visits as told by your doctor. This is important. Contact a doctor if: Get help right away if:  You have any symptoms of stroke or TIA. The acronym BEFAST is an easy way to remember the main warning signs of stroke. ? B = Balance problems. Signs include dizziness, sudden trouble walking, or loss of balance ? E = Eye problems. This includes trouble seeing or a sudden change in vision. ? F = Face changes. This includes sudden weakness or numbness of the face, or the face or eyelid drooping to one side. ? A = Arm weakness or numbness. This happens suddenly and usually on one side of the body. ? S = Speech problems. This includes trouble speaking or  trouble understanding. ? T = Time. Time to call 911 or seek emergency care. Do not wait to see if symptoms go away. Make note of the time your symptoms started.  Other signs of stroke may include: ? A sudden, severe headache with no known cause. ? Feeling sick to your stomach (nauseous) or throwing up (vomiting). ? Seizure. Call your local emergency services (911 in [place] Do notdrive yourself to the clinic or hospital. Summary  The carotid arteries are arteries on both sides of the neck.  If these arteries get smaller or get blocked, you are more likely to have a stroke or a warning stroke (transient ischemic attack).  Take over-the-counter and prescription medicines only as told by your doctor.  Keep all follow-up visits as told by your doctor. This is important. This information is not intended to replace advice given to you by your health care provider. Make sure you discuss any questions you have with your health care provider. Document Released: 03/02/2012 Document Revised: 03/11/2017 Document Reviewed: 03/11/2017 Elsevier Patient Education  2020 Elsevier Inc.  

## 2018-11-18 ENCOUNTER — Other Ambulatory Visit: Payer: Self-pay

## 2018-11-18 ENCOUNTER — Encounter: Payer: Self-pay | Admitting: Cardiothoracic Surgery

## 2018-11-18 ENCOUNTER — Ambulatory Visit (INDEPENDENT_AMBULATORY_CARE_PROVIDER_SITE_OTHER): Payer: Medicare Other | Admitting: Cardiothoracic Surgery

## 2018-11-18 VITALS — BP 154/84 | HR 80 | Temp 97.7°F | Ht 65.0 in | Wt 197.0 lb

## 2018-11-18 DIAGNOSIS — I10 Essential (primary) hypertension: Secondary | ICD-10-CM

## 2018-11-18 DIAGNOSIS — I272 Pulmonary hypertension, unspecified: Secondary | ICD-10-CM | POA: Diagnosis not present

## 2018-11-18 NOTE — Progress Notes (Signed)
Patient ID: Shirley Hardy, female   DOB: [birth year], 73 y.o.   MRN: [id number]  Chief Complaint  Patient presents with  . New Patient (Initial Visit)    pulmonary hypertension    Referred By Dr. Elba Barman Reason for Referral pulmonary hypertension  HPI Location, Quality, Duration, Severity, Timing, Context, Modifying Factors, Associated Signs and Symptoms.  Shirley Hardy is a 73 y.o. female.  She has a past medical history of atrial fibrillation and had an episode that was treated about 2 years ago in [place].  At that time she was undergoing a complete evaluation for the etiology of her atrial fibrillation.  Although she is not aware of any diagnosis that was presented to her she noticed that on her chart she carries the diagnosis of pulmonary hypertension.  She moved here because of the COVID crisis to be with her elderly mother and is been unable to get any details regarding that hospitalization.  Therefore she is unclear if she had an echocardiogram done at that time or not she is also unclear if she had a right heart catheterization or not.  In any event she recently was complaining of some pain under her tongue and this led to the diagnosis of a salivary stone.  A CT scan of the neck was undertaken and the most distal cuts through the neck revealed possible pulmonary hypertension because of an enlarged pulmonary artery.  Otherwise she is essentially asymptomatic.  Her stones are now gone and her tongue pain is gone away.  She states that on rare occasion she may have a fleeting sensation of being slightly short of breath which quickly clears with a few cleansing breaths.  She has no prior history of heart or lung disease.   Past Medical History:  Diagnosis Date  . Allergies   . Atrial flutter ([place])   . Barrett esophagus   . Cancer [facility]) 1999   left breast cancer  . Chronic kidney disease   . GERD (gastroesophageal reflux disease)   . Hypertension   . Renal insufficiency      Past Surgical History:  Procedure Laterality Date  . BREAST LUMPECTOMY Left   . SALIVARY STONE REMOVAL Right 09/15/2018   Procedure: SALIVARY STONE REMOVAL/SIALODUCOPLASTY;  Surgeon: Beverly Gust, MD;  Location: [facility];  Service: ENT;  Laterality: Right;  . TONSILLECTOMY      Family History  Problem Relation Age of Onset  . Breast cancer Mother   . Atrial fibrillation Mother   . Parkinson's disease Brother   . Lung cancer Father   . Cervical cancer Maternal Grandfather     Social History Social History   Tobacco Use  . Smoking status: Never Smoker  . Smokeless tobacco: Never Used  Substance Use Topics  . Alcohol use: Not Currently  . Drug use: Not on file    Allergies  Allergen Reactions  . Contrast Media [Iodinated Diagnostic Agents]   . Latex Rash    Current Outpatient Medications  Medication Sig Dispense Refill  . acetaminophen (TYLENOL) 500 MG tablet Take 500 mg by mouth 2 (two) times a day.    Marland Kitchen acyclovir (ZOVIRAX) 400 MG tablet Take 400 mg by mouth daily.    Marland Kitchen aspirin EC 81 MG tablet Take 81 mg by mouth daily.    . cetirizine (ZYRTEC) 10 MG tablet Take 10 mg by mouth daily as needed for allergies.    Marland Kitchen losartan (COZAAR) 50 MG tablet Take 50 mg by mouth daily.    Marland Kitchen  omeprazole (PRILOSEC) 20 MG capsule Take 20 mg by mouth daily.    . pravastatin (PRAVACHOL) 40 MG tablet Take 40 mg by mouth daily.    . TOPROL XL 25 MG 24 hr tablet Take 25 mg by mouth daily.    . TOPROL XL 50 MG 24 hr tablet Take 50 mg by mouth daily.     No current facility-administered medications for this visit.       Review of Systems A complete review of systems was asked and was negative except for the following positive findings prior history of atrial fibrillation, bronchitis, hypercholesterolemia, hemorrhoids, GE reflux, kidney disease, glasses, left breast surgery with lumpectomy and radiation.  Blood pressure (!) 154/84, pulse 80, temperature 97.7 F (36.5 C),  height 5\' 5"  (1.651 m), weight 197 lb (89.4 kg), SpO2 99 %.  Physical Exam CONSTITUTIONAL:  Pleasant, well-developed, well-nourished, and in no acute distress. EYES: Pupils equal and reactive to light, Sclera non-icteric EARS, NOSE, MOUTH AND THROAT:  The oropharynx was clear.  Dentition is good repair.  Oral mucosa pink and moist. LYMPH NODES:  Lymph nodes in the neck and axillae were normal RESPIRATORY:  Lungs were clear.  Normal respiratory effort without pathologic use of accessory muscles of respiration CARDIOVASCULAR: Heart was regular without murmurs.  There were no carotid bruits. GI: The abdomen was soft, nontender, and nondistended. There were no palpable masses. There was no hepatosplenomegaly. There were normal bowel sounds in all quadrants. GU:  Rectal deferred.   MUSCULOSKELETAL:  Normal muscle strength and tone.  No clubbing or cyanosis.   SKIN:  There were no pathologic skin lesions.  There were no nodules on palpation. NEUROLOGIC:  Sensation is normal.  Cranial nerves are grossly intact. PSYCH:  Oriented to person, place and time.  Mood and affect are normal.  Data Reviewed CT of the neck  I have personally reviewed the patient's imaging, laboratory findings and medical records.    Assessment    I have independently reviewed the patient's CT scan.  There are bilateral enlarged pulmonary arteries.  There is no obvious reason for this.    Plan    I had a long discussion with her regarding how to obtain the records from her prior hospitalization in [place].  She states that she has exhausted all possibilities.  Therefore we will go ahead and get an transthoracic echocardiogram at this time.  She will come back to see me once that is complete.      Nestor Lewandowsky, MD 11/18/2018, 11:42 AM

## 2018-11-18 NOTE — Patient Instructions (Addendum)
Patient to have a echocardiogram scheduled at [facility]  11/29/2018 at 10:00am .  Be there at the medical mall at 945. The patient is aware to call back for any questions or concerns.

## 2018-11-29 ENCOUNTER — Other Ambulatory Visit: Payer: Self-pay

## 2018-11-29 ENCOUNTER — Ambulatory Visit
Admission: RE | Admit: 2018-11-29 | Discharge: 2018-11-29 | Disposition: A | Discharge status: Home / Self Care | Payer: Medicare Other | Source: Ambulatory Visit | Attending: Cardiothoracic Surgery | Admitting: Cardiothoracic Surgery

## 2018-11-29 DIAGNOSIS — I272 Pulmonary hypertension, unspecified: Secondary | ICD-10-CM

## 2018-11-29 NOTE — Progress Notes (Signed)
*  PRELIMINARY RESULTS* Echocardiogram 2D Echocardiogram has been performed.  Shirley Hardy 11/29/2018, 10:29 AM

## 2018-12-09 ENCOUNTER — Encounter: Payer: Self-pay | Admitting: Cardiothoracic Surgery

## 2018-12-09 ENCOUNTER — Other Ambulatory Visit: Payer: Self-pay

## 2018-12-09 ENCOUNTER — Ambulatory Visit (INDEPENDENT_AMBULATORY_CARE_PROVIDER_SITE_OTHER): Payer: Medicare Other | Admitting: Cardiothoracic Surgery

## 2018-12-09 VITALS — BP 135/55 | HR 72 | Temp 97.2°F | Ht 65.0 in | Wt 197.0 lb

## 2018-12-09 DIAGNOSIS — I6523 Occlusion and stenosis of bilateral carotid arteries: Secondary | ICD-10-CM | POA: Diagnosis not present

## 2018-12-09 DIAGNOSIS — I272 Pulmonary hypertension, unspecified: Secondary | ICD-10-CM | POA: Diagnosis not present

## 2018-12-09 NOTE — Progress Notes (Signed)
  Patient ID: Shirley Hardy, female   DOB: April 10, 1945, 73 y.o.   MRN: [id number]  HISTORY: She returns today in follow-up.  She has no new complaints.  She had an echocardiogram performed.  She has attempted to get all of her records from [place] but is not sure that any of her records will be applicable to her current problem.   Vitals:   12/09/18 0949  BP: (!) 135/55  Pulse: 72  Temp: (!) 97.2 F (36.2 C)  SpO2: 97%     EXAM:    Resp: Lungs are clear bilaterally.  No respiratory distress, normal effort. Heart:  Regular without murmurs Abd:  Abdomen is soft, non distended and non tender. No masses are palpable.  There is no rebound and no guarding.  Neurological: Alert and oriented to person, place, and time. Coordination normal.  Skin: Skin is warm and dry. No rash noted. No diaphoretic. No erythema. No pallor.  Psychiatric: Normal mood and affect. Normal behavior. Judgment and thought content normal.    ASSESSMENT: She did have her echocardiogram performed.  That reveals normal left ventricular and right ventricular function.  The right ventricle is not enlarged.  There is mild pulmonary and tricuspid insufficiency suggesting that pulmonary hypertension is not a significant problem.   PLAN:   She will continue to attempt to get her records from [place].  She is heading back there for a visit and may move up to [place].  I explained to her that if she has any issues or problems that I be happy to see her for that.    Nestor Lewandowsky, MD

## 2018-12-09 NOTE — Patient Instructions (Signed)
   Follow-up with our office as needed.  Please call and ask to speak with a nurse if you develop questions or concerns.  

## 2020-10-22 IMAGING — US BILATERAL CAROTID DUPLEX ULTRASOUND
1 series · 13 of 24 positions shown · non-contrast
Comparison: 08/15/2018

CLINICAL DATA: Carotid atherosclerosis

EXAM:
BILATERAL CAROTID DUPLEX ULTRASOUND
TECHNIQUE: Gray scale imaging, color Doppler and duplex ultrasound were
performed of bilateral carotid and vertebral arteries in the neck.

[Series 1: bilateral carotid duplex ultrasound · 13 of 66 slices shown]
[im 1/66]
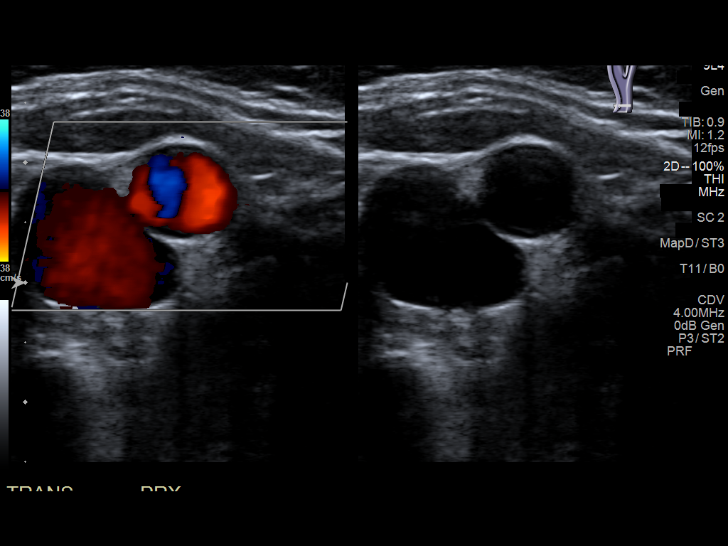
[im 6/66]
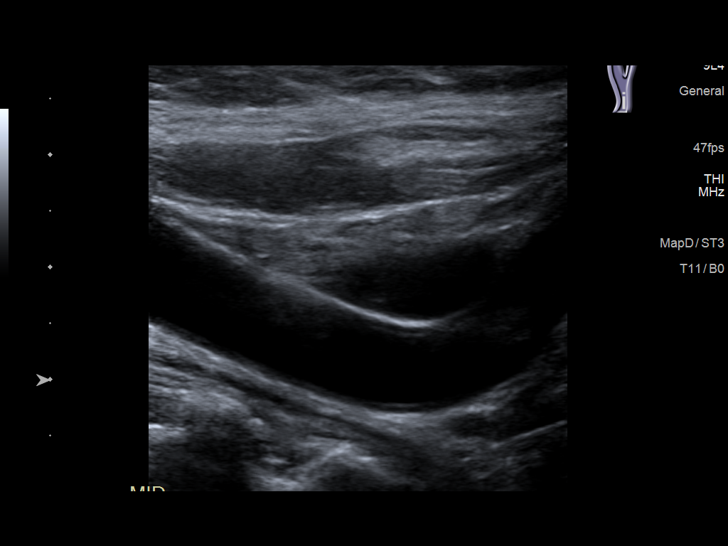
[im 12/66]
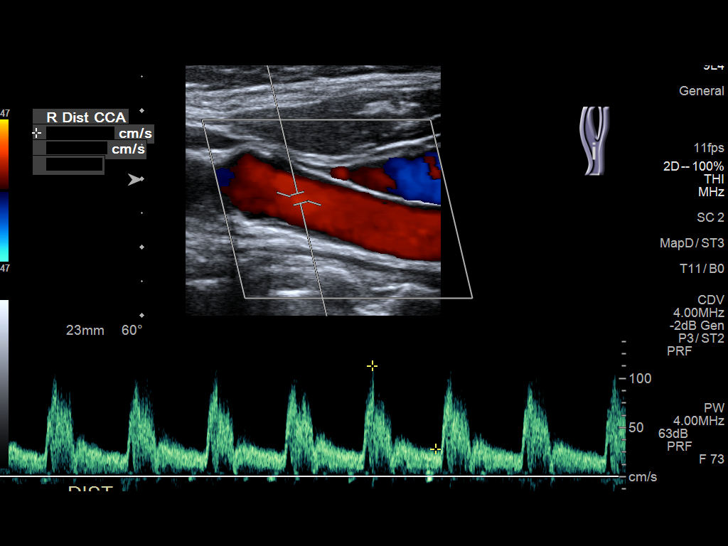
[im 17/66]
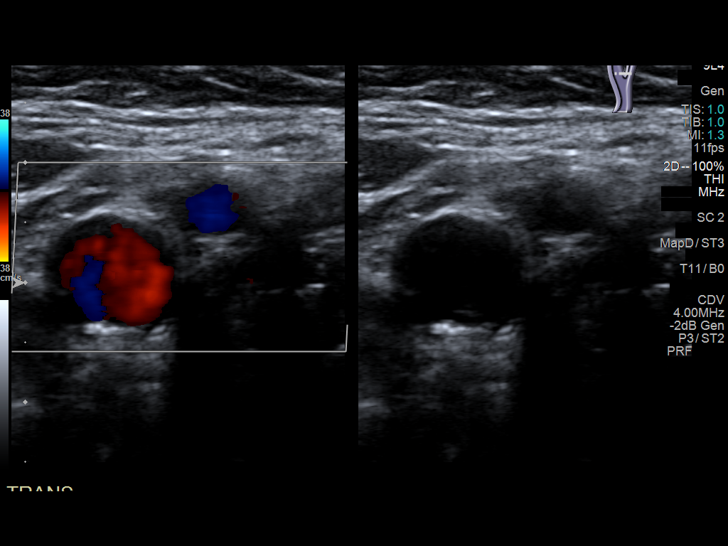
[im 23/66]
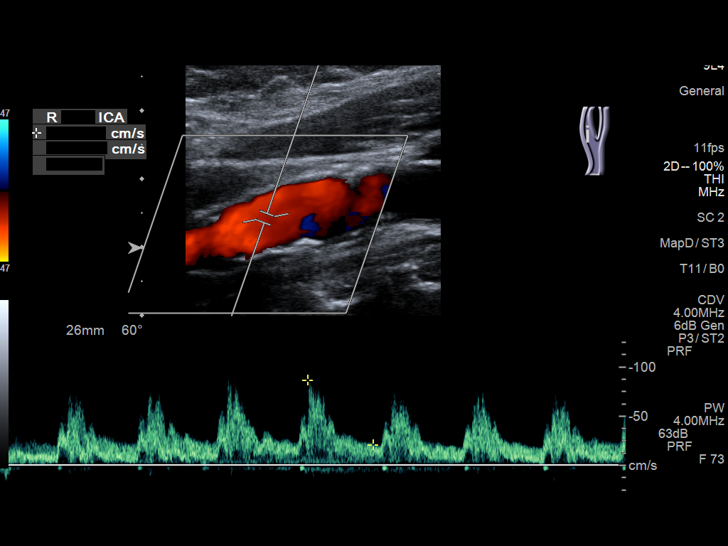
[im 29/66]
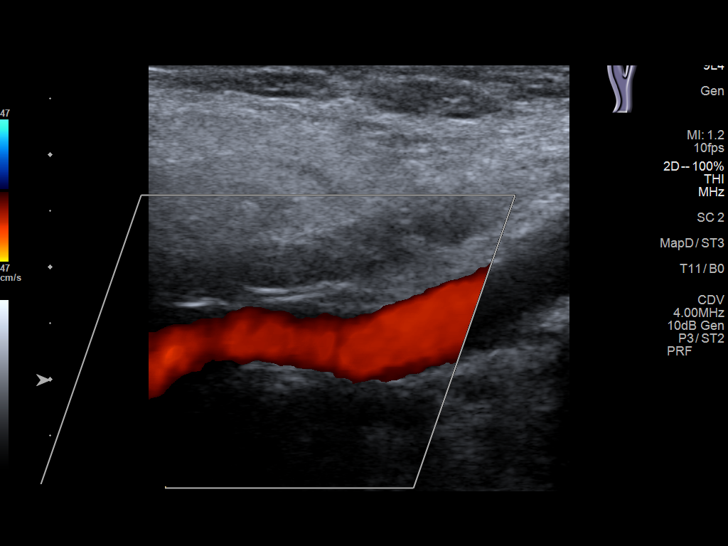
[im 34/66]
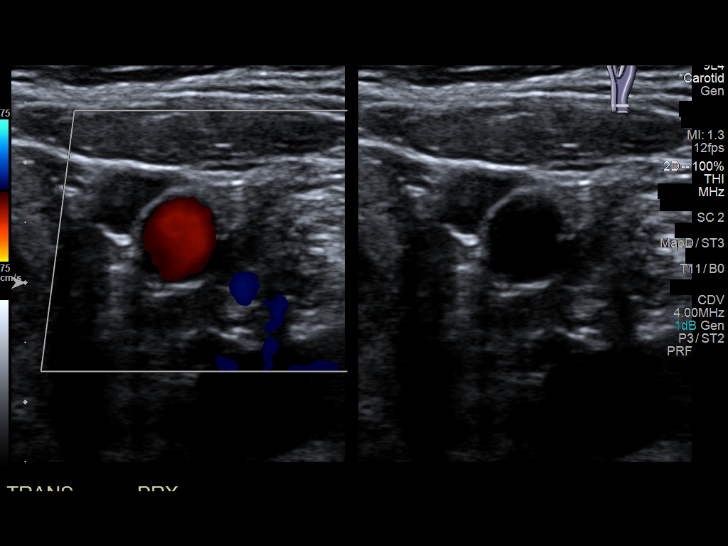
[im 37/66]
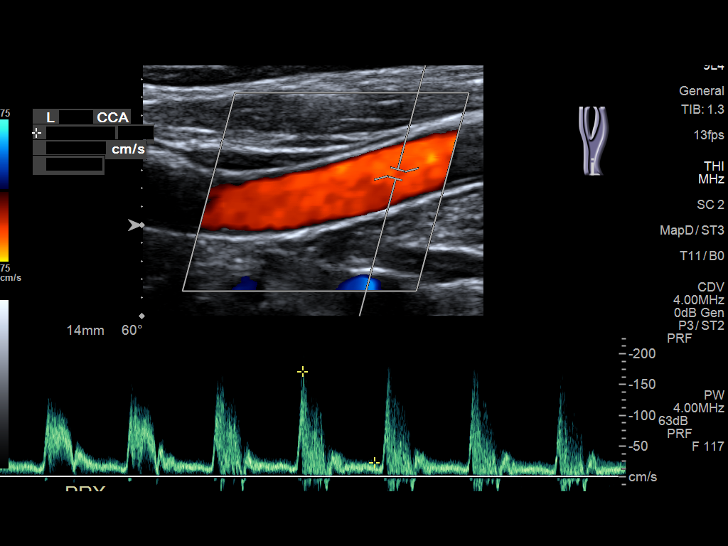
[im 43/66]
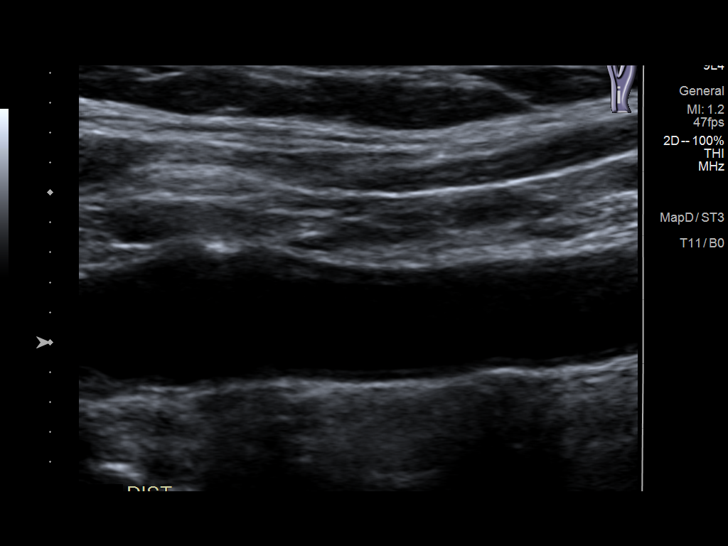
[im 49/66]
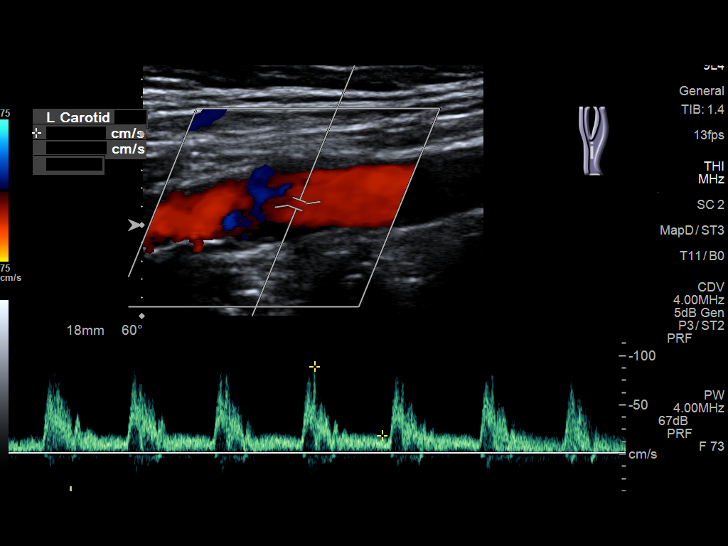
[im 54/66]
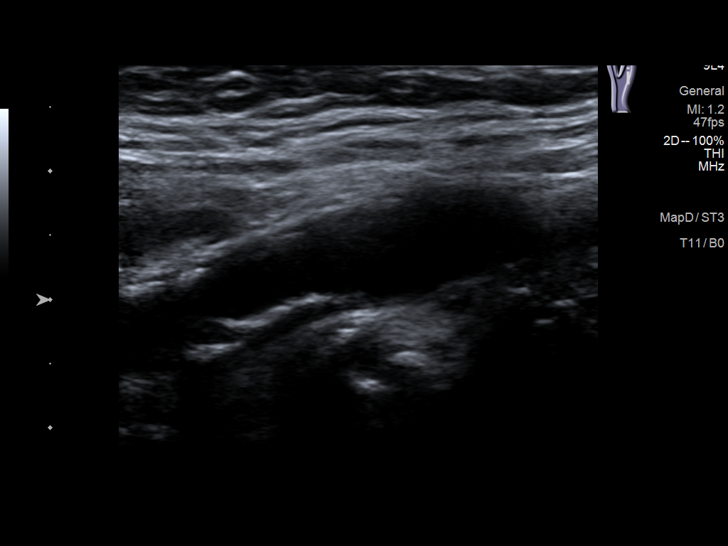
[im 60/66]
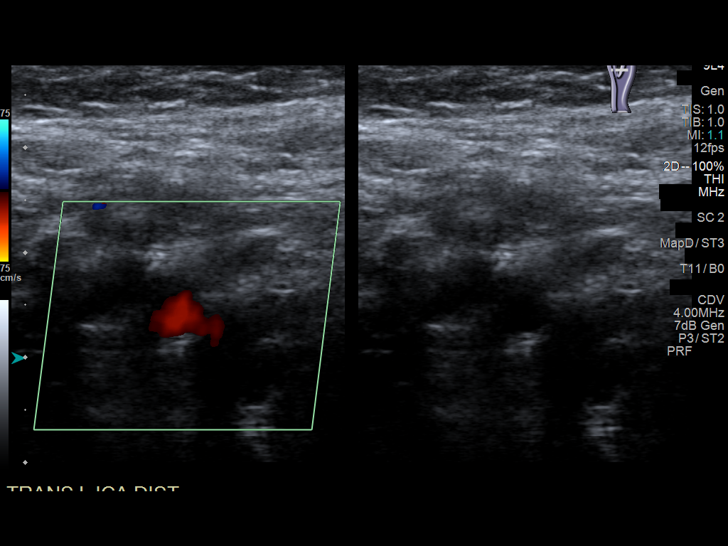
[im 66/66]
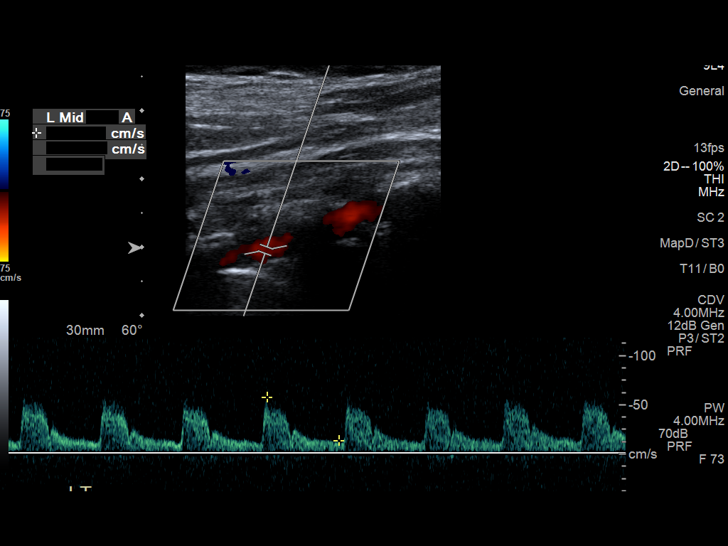

[13 of 24 positions shown; findings below may reference images not displayed]

FINDINGS: Criteria: Quantification of carotid stenosis is based on velocity
parameters that correlate the residual internal carotid diameter
with NASCET-based stenosis levels, using the diameter of the distal
internal carotid lumen as the denominator for stenosis measurement.

The following velocity measurements were obtained:

RIGHT

ICA: 104/35 cm/sec

CCA: 113/26 cm/sec

SYSTOLIC ICA/CCA RATIO:

ECA: 110 cm/sec

LEFT

ICA: 160/42 cm/sec

CCA: 124/24 cm/sec

SYSTOLIC ICA/CCA RATIO:

ECA: 113 cm/sec

RIGHT CAROTID ARTERY: Minor echogenic shadowing plaque formation. No
hemodynamically significant right ICA stenosis, velocity elevation,
or turbulent flow. Degree of narrowing less than 50%.

RIGHT VERTEBRAL ARTERY:  Antegrade

LEFT CAROTID ARTERY: Moderate heterogeneous calcific
atherosclerosis. Mild velocity elevation measuring 160/42
centimeters/second. No significant turbulent flow. Degree of
narrowing estimated at 50-69% (closer to the 50% range).

LEFT VERTEBRAL ARTERY:  Antegrade
IMPRESSION: Right ICA narrowing less than 50%

Left ICA narrowing estimated at 50-69% by ultrasound criteria
(closer to the 50% range).

Patent antegrade vertebral flow bilaterally
